# Patient Record
Sex: Male | Born: 2000 | Race: Black or African American | Hispanic: No | Marital: Single | Smoking: Never smoker
Health system: Southern US, Community
[De-identification: ages and names within clinical notes are randomized; demographics above are authoritative.]

## PROBLEM LIST (undated history)

## (undated) HISTORY — PX: TONSILLECTOMY: SUR1361

## (undated) HISTORY — PX: TYMPANOSTOMY TUBE PLACEMENT: SHX32

---
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
## 2019-03-13 ENCOUNTER — Other Ambulatory Visit: Payer: Self-pay

## 2019-03-13 DIAGNOSIS — Z20822 Contact with and (suspected) exposure to covid-19: Secondary | ICD-10-CM

## 2019-03-15 LAB — NOVEL CORONAVIRUS, NAA: SARS-CoV-2, NAA: NOT DETECTED

## 2019-11-11 ENCOUNTER — Emergency Department (HOSPITAL_COMMUNITY)
Admission: EM | Admit: 2019-11-11 | Discharge: 2019-11-11 | Disposition: A | Discharge status: Home / Self Care | Payer: Medicaid Other | Attending: Emergency Medicine | Admitting: Emergency Medicine

## 2019-11-11 ENCOUNTER — Emergency Department (HOSPITAL_COMMUNITY): Payer: Medicaid Other

## 2019-11-11 ENCOUNTER — Encounter (HOSPITAL_COMMUNITY): Payer: Self-pay

## 2019-11-11 DIAGNOSIS — Z5321 Procedure and treatment not carried out due to patient leaving prior to being seen by health care provider: Secondary | ICD-10-CM | POA: Diagnosis not present

## 2019-11-11 DIAGNOSIS — R0789 Other chest pain: Secondary | ICD-10-CM | POA: Diagnosis not present

## 2019-11-11 LAB — BASIC METABOLIC PANEL
Anion gap: 9 (ref 5–15)
BUN: 12 mg/dL (ref 6–20)
CO2: 24 mmol/L (ref 22–32)
Calcium: 9.2 mg/dL (ref 8.9–10.3)
Chloride: 105 mmol/L (ref 98–111)
Creatinine, Ser: 1.08 mg/dL (ref 0.61–1.24)
GFR calc Af Amer: >60 mL/min (ref >60)
GFR calc non Af Amer: >60 mL/min (ref >60)
Glucose, Bld: 96 mg/dL (ref 70–99)
Potassium: 3.4 mmol/L — ABNORMAL LOW (ref 3.5–5.1)
Sodium: 138 mmol/L (ref 135–145)

## 2019-11-11 LAB — CBC
HCT: 43.5 % (ref 39.0–52.0)
Hemoglobin: 13.6 g/dL (ref 13.0–17.0)
MCH: 25.4 pg — ABNORMAL LOW (ref 26.0–34.0)
MCHC: 31.3 g/dL (ref 30.0–36.0)
MCV: 81.3 fL (ref 80.0–100.0)
Platelets: 271 10*3/uL (ref 150–400)
RBC: 5.35 MIL/uL (ref 4.22–5.81)
RDW: 13.6 % (ref 11.5–15.5)
WBC: 9.1 10*3/uL (ref 4.0–10.5)
nRBC: 0 % (ref 0.0–0.2)

## 2019-11-11 LAB — TROPONIN I (HIGH SENSITIVITY): Troponin I (High Sensitivity): 4 ng/L (ref <18)

## 2019-11-11 MED ORDER — ACETAMINOPHEN 325 MG PO TABS: 650.0000 mg | ORAL_TABLET | Freq: Once | ORAL | Status: DC

## 2019-11-11 NOTE — ED Notes (Signed)
Patient handed staff his labels and stated he was leaving.

## 2019-11-11 NOTE — ED Triage Notes (Signed)
Pt arrives POV for eval sharp centralized CP x 2 hours. Pt reports onset this afternoon while at home. Denies associated SOB/N/V. No radiation

## 2019-11-12 ENCOUNTER — Ambulatory Visit (HOSPITAL_COMMUNITY)
Admission: EM | Admit: 2019-11-12 | Discharge: 2019-11-12 | Disposition: A | Discharge status: Home / Self Care | Payer: Medicaid Other | Attending: Emergency Medicine | Admitting: Emergency Medicine

## 2019-11-12 ENCOUNTER — Encounter (HOSPITAL_COMMUNITY): Payer: Self-pay

## 2019-11-12 ENCOUNTER — Other Ambulatory Visit: Payer: Self-pay

## 2019-11-12 DIAGNOSIS — R0789 Other chest pain: Secondary | ICD-10-CM | POA: Insufficient documentation

## 2019-11-12 DIAGNOSIS — R509 Fever, unspecified: Secondary | ICD-10-CM | POA: Diagnosis not present

## 2019-11-12 DIAGNOSIS — Z79899 Other long term (current) drug therapy: Secondary | ICD-10-CM | POA: Diagnosis not present

## 2019-11-12 DIAGNOSIS — Z20822 Contact with and (suspected) exposure to covid-19: Secondary | ICD-10-CM | POA: Diagnosis not present

## 2019-11-12 LAB — SARS CORONAVIRUS 2 (TAT 6-24 HRS): SARS Coronavirus 2: NEGATIVE

## 2019-11-12 NOTE — Discharge Instructions (Signed)
Tylenol and/or ibuprofen as needed for pain or fevers.  Self isolate until covid results are back and negative.  Will notify you by phone of any positive findings. Your negative results will be sent through your MyChart.     Rest.  Push fluids to ensure adequate hydration and keep secretions thin.  If symptoms worsen or do not improve in the next week to return to be seen or to follow up with your PCP. Marland Kitchen

## 2019-11-12 NOTE — ED Provider Notes (Signed)
[facility]    CSN: [id number] Arrival date & time: 11/12/19  0800      History   Chief Complaint Chief Complaint  Patient presents with  . Fever    HPI Robert Hunt is a 19 y.o. male.   Robert Hunt presents with complaints of fevers which started 2 days ago. Chest discomfort- tightness, started yesterday. Today feels well, much better, states his mother wanted him to be evaluated, however. No cough. Went to the ER yesterday but LWBS. Labs and chest xray had been completed prior to his departure. His uncle has been ill with a cold. No history of covid-19 and has not received vaccination. No GI symptoms. Hasn't taken any medications for symptoms.    ROS per HPI, negative if not otherwise mentioned.      History reviewed. No pertinent past medical history.  There are no problems to display for this patient.   Past Surgical History:  Procedure Laterality Date  . TONSILLECTOMY    . TYMPANOSTOMY TUBE PLACEMENT         Home Medications    Prior to Admission medications   Medication Sig Start Date End Date Taking? Authorizing Provider  cyclobenzaprine (FLEXERIL) 10 MG tablet Take 1 tablet (10 mg total) by mouth 3 (three) times daily. 11/03/17   Mardella Layman, MD  omeprazole (PRILOSEC) 40 MG capsule Take 1 capsule (40 mg total) by mouth 2 (two) times daily. 08/23/16 09/06/16  Dorena Bodo, NP  ondansetron (ZOFRAN ODT) 4 MG disintegrating tablet Take 1 tablet (4 mg total) by mouth every 8 (eight) hours as needed for nausea or vomiting. 12/08/16   Mardella Layman, MD    Family History Family History  Family history unknown: Yes    Social History Social History   Tobacco Use  . Smoking status: Never Smoker  . Smokeless tobacco: Never Used  Substance Use Topics  . Alcohol use: No  . Drug use: No     Allergies   Patient has no known allergies.   Review of Systems Review of Systems   Physical Exam Triage Vital Signs ED Triage Vitals    Enc Vitals Group     BP 11/12/19 0818 (!) 133/92     Pulse Rate 11/12/19 0818 100     Resp 11/12/19 0818 18     Temp 11/12/19 0818 100.2 F (37.9 C)     Temp Source 11/12/19 0818 Oral     SpO2 11/12/19 0818 99 %     Weight --      Height --      Head Circumference --      Peak Flow --      Pain Score 11/12/19 0819 0     Pain Loc --      Pain Edu? --      Excl. in GC? --    No data found.  Updated Vital Signs BP (!) 133/92 (BP Location: Left Arm)   Pulse 100   Temp 100.2 F (37.9 C) (Oral)   Resp 18   SpO2 99%     Physical Exam Constitutional:      Appearance: Normal appearance. He is well-developed. He is not toxic-appearing.  HENT:     Head: Normocephalic and atraumatic.  Cardiovascular:     Rate and Rhythm: Normal rate.  Pulmonary:     Effort: Pulmonary effort is normal.     Breath sounds: Normal breath sounds.  Skin:    General: Skin is warm and dry.  Neurological:     Mental Status: He is alert and oriented to person, place, and time.      UC Treatments / Results  Labs (all labs ordered are listed, but only abnormal results are displayed) Labs Reviewed  SARS CORONAVIRUS 2 (TAT 6-24 HRS)    EKG   Radiology DG Chest 2 View  Result Date: 11/11/2019 CLINICAL DATA:  Lambert Mody generalized chest pain for 2 hours EXAM: CHEST - 2 VIEW COMPARISON:  Radiograph 04/24/2013 FINDINGS: No consolidation, features of edema, pneumothorax, or effusion. Pulmonary vascularity is normally distributed. The cardiomediastinal contours are unremarkable. No acute osseous or soft tissue abnormality. IMPRESSION: No acute cardiopulmonary abnormality. Electronically Signed   By: Kreg Shropshire M.D.   On: 11/11/2019 19:08    Procedures Procedures (including critical care time)  Medications Ordered in UC Medications - No data to display  Initial Impression / Assessment and Plan / UC Course  I have reviewed the triage vital signs and the nursing notes.  Pertinent labs & imaging  results that were available during my care of the patient were reviewed by me and considered in my medical decision making (see chart for details).     Noted low grade temp here today. No work of breathing. Overall feels better. Labs and CXR from ER reviewed and without acute findings. covid testing collected and pending. Supportive cares recommended. Return precautions provided. Patient verbalized understanding and agreeable to plan.   Final Clinical Impressions(s) / UC Diagnoses   Final diagnoses:  Febrile illness     Discharge Instructions     Tylenol and/or ibuprofen as needed for pain or fevers.  Self isolate until covid results are back and negative.  Will notify you by phone of any positive findings. Your negative results will be sent through your MyChart.     Rest.  Push fluids to ensure adequate hydration and keep secretions thin.  If symptoms worsen or do not improve in the next week to return to be seen or to follow up with your PCP. Marland Kitchen     ED Prescriptions    None     PDMP not reviewed this encounter.   Georgetta Haber, NP 11/12/19 [phone]

## 2019-11-12 NOTE — ED Triage Notes (Signed)
Pt presents with fever since yesterday.

## 2021-11-05 ENCOUNTER — Emergency Department (HOSPITAL_BASED_OUTPATIENT_CLINIC_OR_DEPARTMENT_OTHER): Payer: Medicaid Other | Admitting: Radiology

## 2021-11-05 ENCOUNTER — Other Ambulatory Visit: Payer: Self-pay

## 2021-11-05 ENCOUNTER — Emergency Department (HOSPITAL_COMMUNITY)
Admission: EM | Admit: 2021-11-05 | Discharge: 2021-11-05 | Discharge status: Against Medical Advice | Payer: Medicaid Other | Attending: Emergency Medicine | Admitting: Emergency Medicine

## 2021-11-05 ENCOUNTER — Emergency Department (HOSPITAL_BASED_OUTPATIENT_CLINIC_OR_DEPARTMENT_OTHER)
Admission: EM | Admit: 2021-11-05 | Discharge: 2021-11-05 | Discharge status: Against Medical Advice | Payer: Medicaid Other | Source: Home / Self Care

## 2021-11-05 ENCOUNTER — Encounter (HOSPITAL_BASED_OUTPATIENT_CLINIC_OR_DEPARTMENT_OTHER): Payer: Self-pay

## 2021-11-05 DIAGNOSIS — Z5321 Procedure and treatment not carried out due to patient leaving prior to being seen by health care provider: Secondary | ICD-10-CM | POA: Insufficient documentation

## 2021-11-05 DIAGNOSIS — M25562 Pain in left knee: Secondary | ICD-10-CM | POA: Insufficient documentation

## 2021-11-05 DIAGNOSIS — Y92410 Unspecified street and highway as the place of occurrence of the external cause: Secondary | ICD-10-CM | POA: Insufficient documentation
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
## 2021-11-05 NOTE — ED Notes (Signed)
Pt left. NT was able to get MSE signed. Pt stated "I'm not going to be seen here".
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
## 2021-11-19 IMAGING — CR DG CHEST 2V
2 series · 2 of 2 positions shown · non-contrast
Comparison: Radiograph 04/24/2013

CLINICAL DATA: Sharp generalized chest pain for 2 hours

EXAM:
CHEST - 2 VIEW

[chest pa]
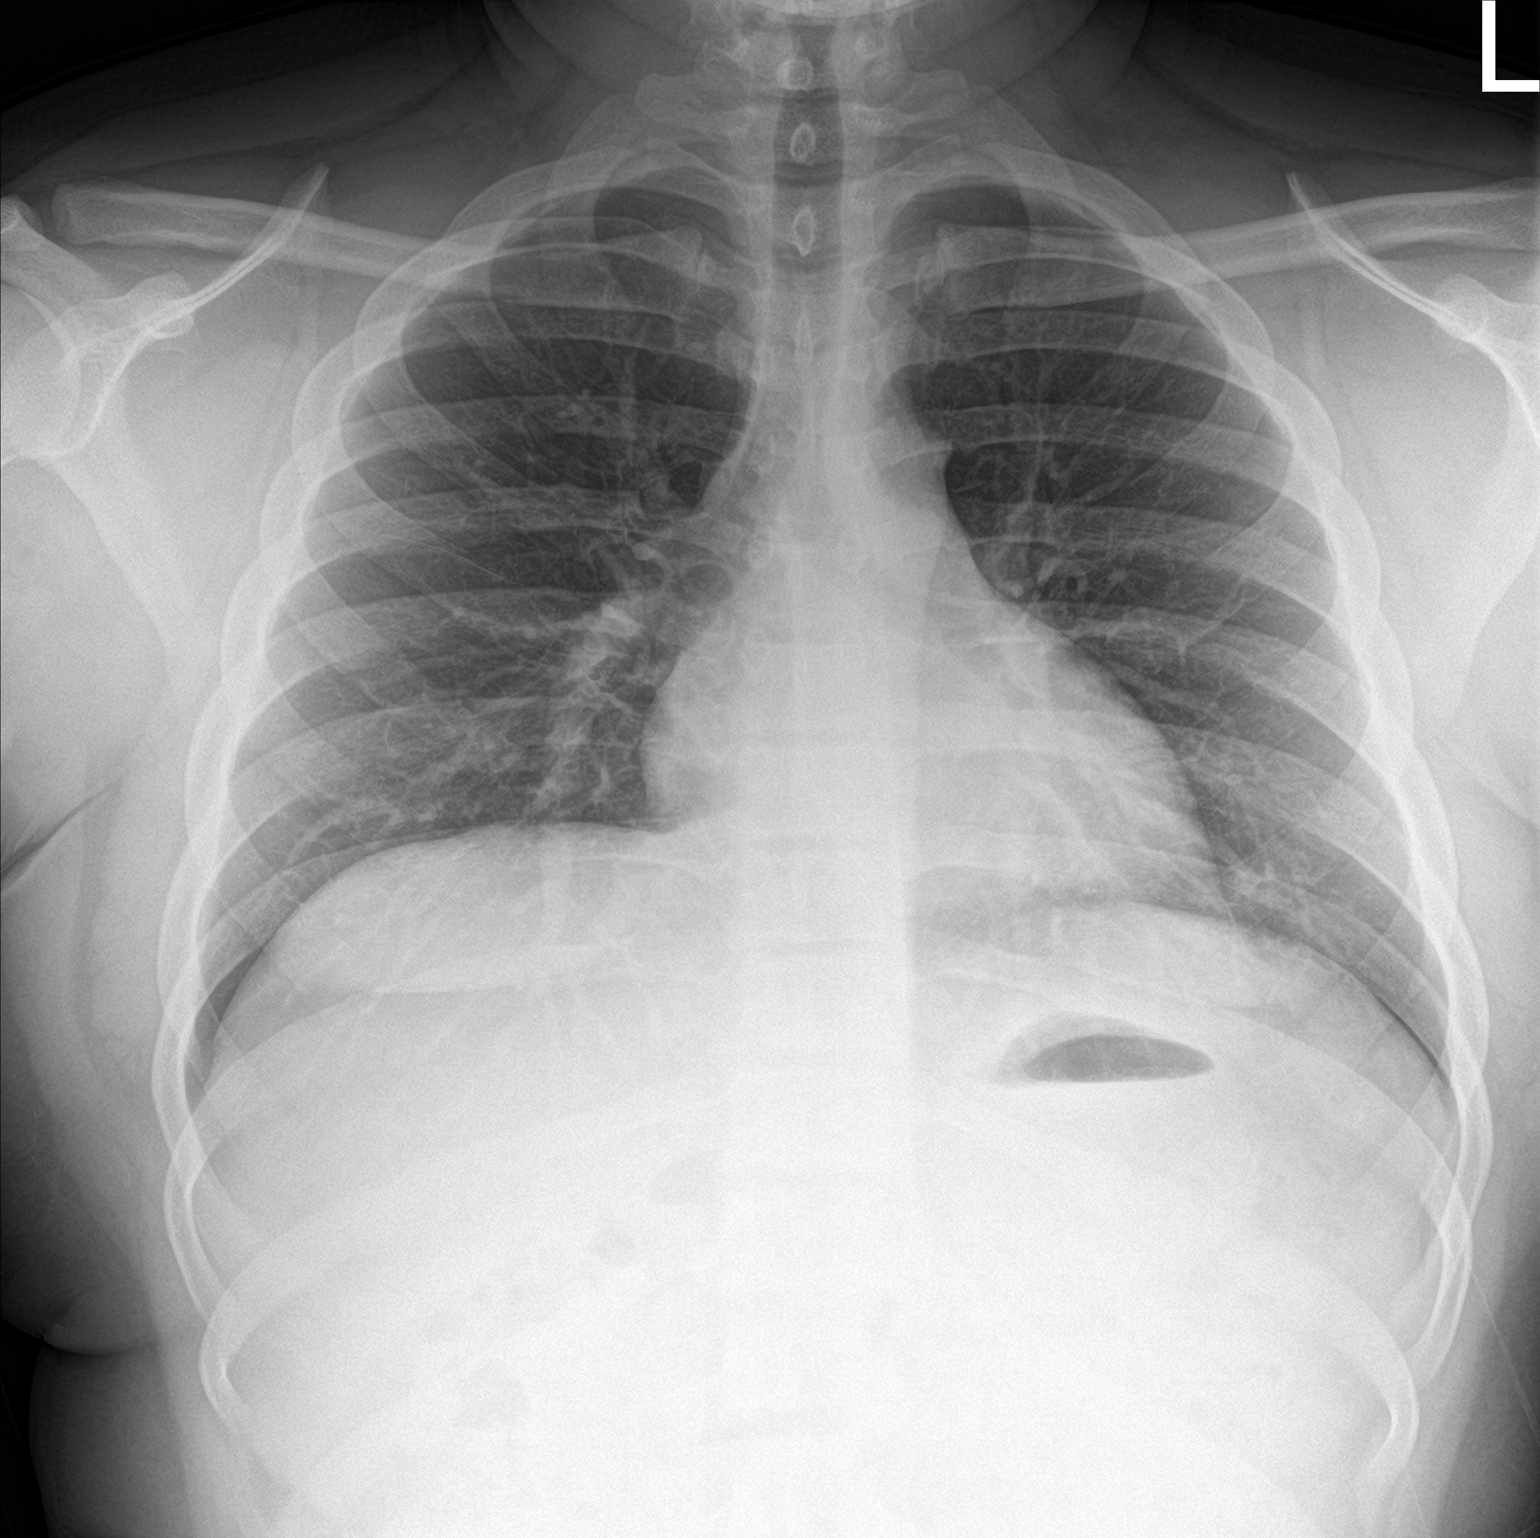

[chest lat]
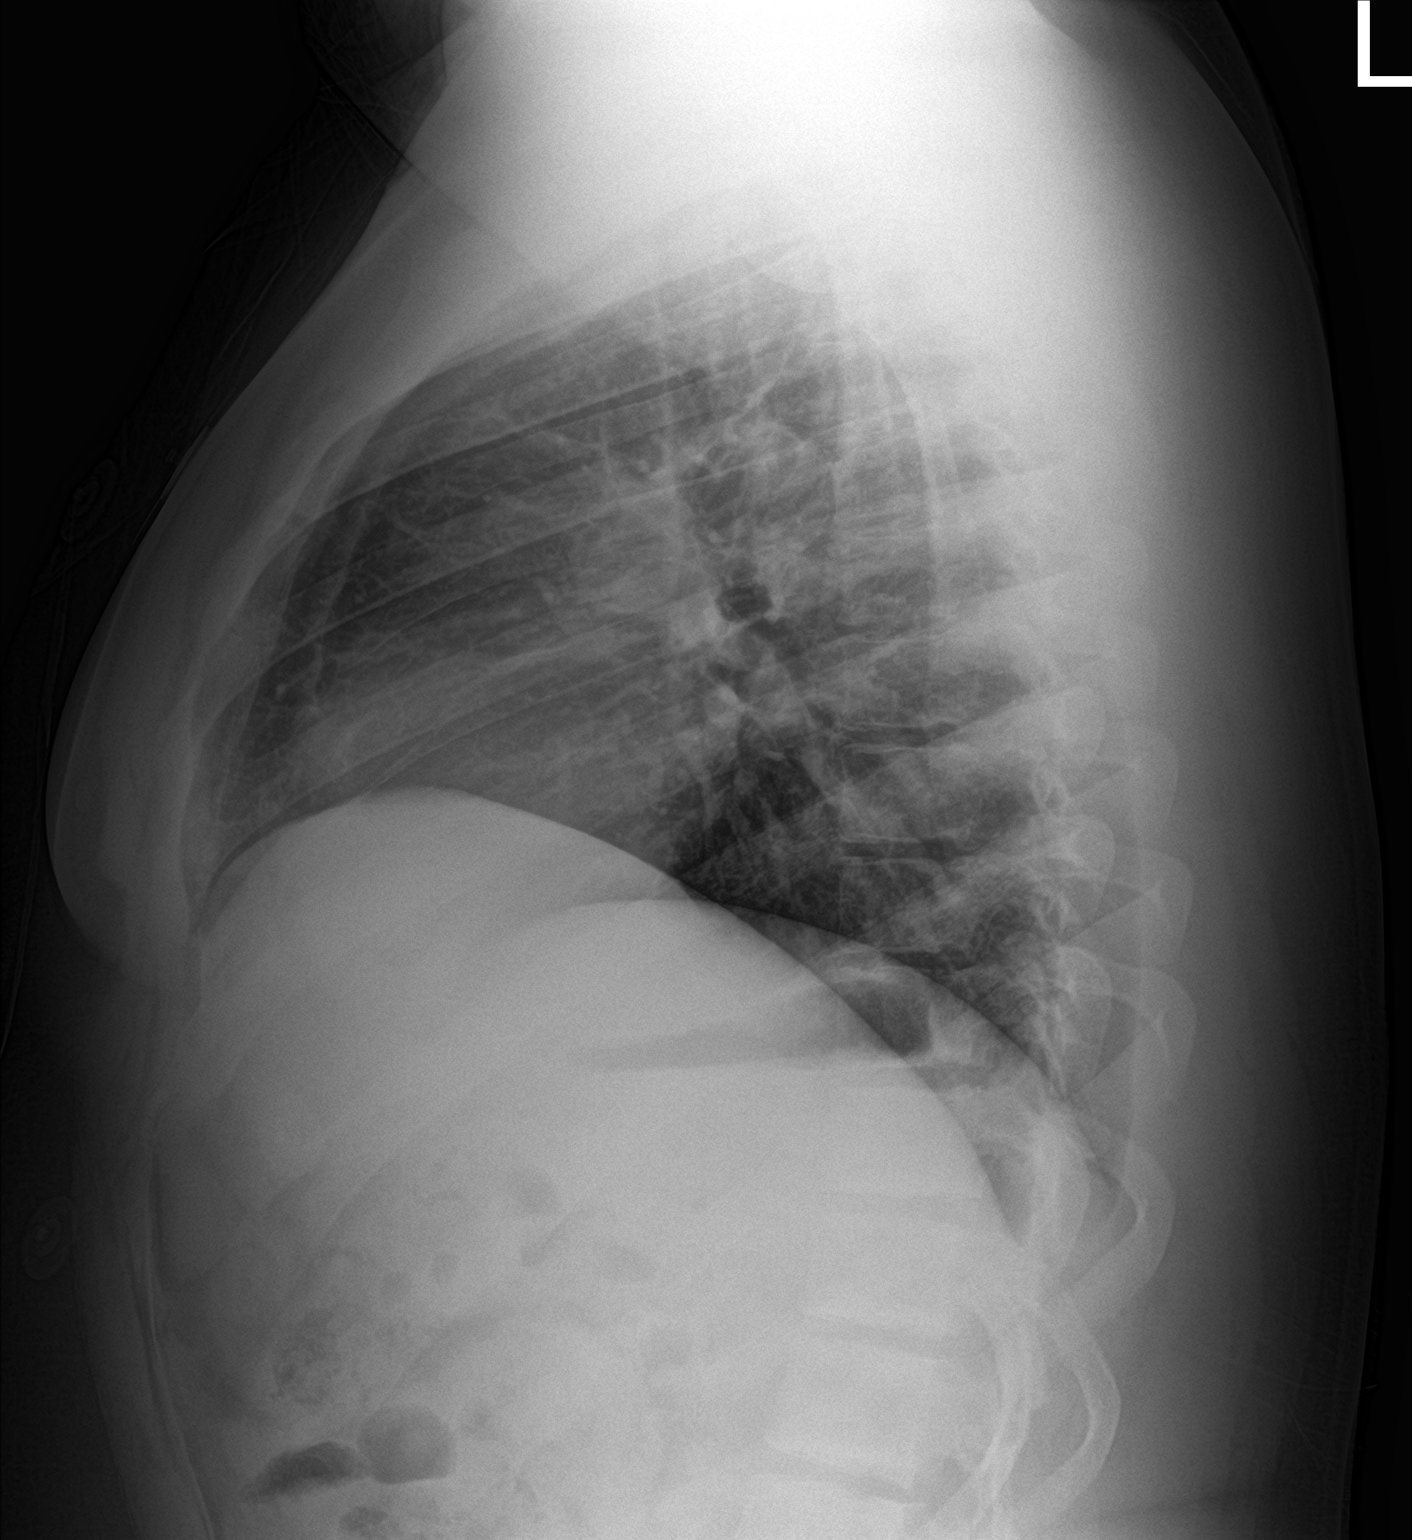

[2 of 2 positions shown; findings below may reference images not displayed]

FINDINGS: No consolidation, features of edema, pneumothorax, or effusion.
Pulmonary vascularity is normally distributed. The cardiomediastinal
contours are unremarkable. No acute osseous or soft tissue
abnormality.
IMPRESSION: No acute cardiopulmonary abnormality.
